# Patient Record
Sex: Female | Born: 1970 | Race: White | Hispanic: No | Marital: Married | Smoking: Never smoker
Health system: Southern US, Community
[De-identification: ages and names within clinical notes are randomized; demographics above are authoritative.]

## PROBLEM LIST (undated history)

## (undated) DIAGNOSIS — K8020 Calculus of gallbladder without cholecystitis without obstruction: Secondary | ICD-10-CM

## (undated) DIAGNOSIS — I10 Essential (primary) hypertension: Secondary | ICD-10-CM

## (undated) DIAGNOSIS — J302 Other seasonal allergic rhinitis: Secondary | ICD-10-CM

## (undated) HISTORY — PX: TUBAL LIGATION: SHX77

## (undated) HISTORY — PX: CHOLECYSTECTOMY: SHX55

## (undated) HISTORY — PX: BREAST SURGERY: SHX581

---

## 2015-07-03 ENCOUNTER — Ambulatory Visit: Payer: Self-pay | Admitting: Surgery

## 2015-07-03 DIAGNOSIS — D242 Benign neoplasm of left breast: Secondary | ICD-10-CM

## 2015-07-03 NOTE — H&P (Signed)
Kayla Newman 07/03/2015 2:49 PM Location: [facility] Patient #: [id number] DOB: [birth year] Married / Language: Kayla Newman / Race: White Female History of Present Illness Kayla Moores A. 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 This area had been followed for 6 months and had changed prompting biopsy. No pain mass or discharge. Great aunt with breast cancer.  The patient is a 44 year old female   Other Problems Kayla Newman, Kayla Newman; 07/03/2015 2:49 PM) Cholelithiasis Gastroesophageal Reflux Disease High blood pressure Kidney Stone Lump In Breast  Past Surgical History Kayla Newman, Kayla Newman; 07/03/2015 2:49 PM) Breast Biopsy Left. Cesarean Section - 1 Gallbladder Surgery - Laparoscopic Oral Surgery  Diagnostic Studies History Kayla Newman, Kayla Newman; 07/03/2015 2:49 PM) Colonoscopy never Mammogram within last year Pap Smear 1-5 years ago  Allergies Kayla Newman, Newman; 07/03/2015 2:49 PM) No Known Drug Allergies 07/03/2015  Medication History Kayla Newman, Kayla Newman; 07/03/2015 2:50 PM) Cholestyramine (4GM Packet, Oral) Active. Montelukast Sodium (10MG  Tablet, Oral) Active. Metoprolol Succinate ER (25MG  Tablet ER 24HR, Oral) Active. ZyrTEC Allergy (10MG  Tablet, Oral) Active. Flonase (50MCG/ACT Suspension, Nasal) Active.  Social History Kayla Newman, Kayla Newman; 07/03/2015 2:49 PM) Alcohol use Occasional alcohol use. Caffeine use Carbonated beverages, Tea. No drug use Tobacco use Never smoker.  Family History Kayla Newman, Kayla Newman; 07/03/2015 2:49 PM) Heart Disease Mother. Heart disease in female family member before age 31 Heart disease in female family member before age 71 Hypertension Mother. Melanoma Mother.  Pregnancy / Birth History Kayla Newman, Kayla Newman;  07/03/2015 2:49 PM) Age at menarche 78 years. Contraceptive History Contraceptive implant, Oral contraceptives. Gravida 3 Maternal age 46-20 Para 3 Regular periods     Review of Systems (Kayla Newman; 07/03/2015 2:49 PM) General Not Present- Appetite Loss, Chills, Fatigue, Fever, Night Sweats, Weight Gain and Weight Loss. Skin Not Present- Change in Wart/Mole, Dryness, Hives, Jaundice, New Lesions, Non-Healing Wounds, Rash and Ulcer. HEENT Present- Seasonal Allergies and Wears glasses/contact lenses. Not Present- Earache, Hearing Loss, Hoarseness, Nose Bleed, Oral Ulcers, Ringing in the Ears, Sinus Pain, Sore Throat, Visual Disturbances and Yellow Eyes. Respiratory Not Present- Bloody sputum, Chronic Cough, Difficulty Breathing, Snoring and Wheezing. Breast Present- Breast Mass. Not Present- Breast Pain, Nipple Discharge and Skin Changes. Cardiovascular Not Present- Chest Pain, Difficulty Breathing Lying Down, Leg Cramps, Palpitations, Rapid Heart Rate, Shortness of Breath and Swelling of Extremities. Gastrointestinal Not Present- Abdominal Pain, Bloating, Bloody Stool, Change in Bowel Habits, Chronic diarrhea, Constipation, Difficulty Swallowing, Excessive gas, Gets full quickly at meals, Hemorrhoids, Indigestion, Nausea, Rectal Pain and Vomiting. Female Genitourinary Present- Frequency. Not Present- Nocturia, Painful Urination, Pelvic Pain and Urgency. Musculoskeletal Not Present- Back Pain, Joint Pain, Joint Stiffness, Muscle Pain, Muscle Weakness and Swelling of Extremities. Neurological Not Present- Decreased Memory, Fainting, Headaches, Numbness, Seizures, Tingling, Tremor, Trouble walking and Weakness. Psychiatric Not Present- Anxiety, Bipolar, Change in Sleep Pattern, Depression, Fearful and Frequent crying. Endocrine Not Present- Cold Intolerance, Excessive Hunger, Hair Changes, Heat Intolerance, Hot flashes and New Diabetes. Hematology Present- Easy Bruising. Not  Present- Excessive bleeding, Gland problems, HIV and Persistent Infections.  Vitals Kayla Newman; 07/03/2015 2:49 PM) 07/03/2015 2:49 PM Weight: 173.13 lb Height: 59in Body Surface Area: 1.81 m Body Mass Index: 34.97 kg/m BP: 132/84 (Sitting, Left Arm, Standard)     Physical Exam (Kayla Rohrig A.  Kayla Paulsen MD; 07/03/2015 4:20 PM)  General Mental Status-Alert. General Appearance-Consistent with stated age. Hydration-Well hydrated. Voice-Normal.  Head and Neck Head-normocephalic, atraumatic with no lesions or palpable masses. Trachea-midline. Thyroid Gland Characteristics - normal size and consistency.  Eye Eyeball - Bilateral-Extraocular movements intact. Sclera/Conjunctiva - Bilateral-No scleral icterus.  Chest and Lung Exam Chest and lung exam reveals -quiet, even and easy respiratory effort with no use of accessory muscles and on auscultation, normal breath sounds, no adventitious sounds and normal vocal resonance. Inspection Chest Wall - Normal. Back - normal.  Breast Breast - Left-Symmetric, Non Tender, No Biopsy scars, no Dimpling, No Inflammation, No Lumpectomy scars, No Mastectomy scars, No Peau d' Orange. Breast - Right-Symmetric, Non Tender, No Biopsy scars, no Dimpling, No Inflammation, No Lumpectomy scars, No Mastectomy scars, No Peau d' Orange. Breast Lump-No Palpable Breast Mass.  Cardiovascular Cardiovascular examination reveals -normal heart sounds, regular rate and rhythm with no murmurs and normal pedal pulses bilaterally.  Abdomen Inspection Inspection of the abdomen reveals - No Hernias. Skin - Scar - no surgical scars. Palpation/Percussion Palpation and Percussion of the abdomen reveal - Soft, Non Tender, No Rebound tenderness, No Rigidity (guarding) and No hepatosplenomegaly. Auscultation Auscultation of the abdomen reveals - Bowel sounds normal.  Neurologic Neurologic evaluation reveals -alert and oriented x 3  with no impairment of recent or remote memory. Mental Status-Normal.  Musculoskeletal Normal Exam - Left-Upper Extremity Strength Normal and Lower Extremity Strength Normal. Normal Exam - Right-Upper Extremity Strength Normal and Lower Extremity Strength Normal.  Lymphatic Head & Neck  General Head & Neck Lymphatics: Bilateral - Description - Normal. Axillary  General Axillary Region: Bilateral - Description - Normal. Tenderness - Non Tender. Femoral & Inguinal  Generalized Femoral & Inguinal Lymphatics: Bilateral - Description - Normal. Tenderness - Non Tender.    Assessment & Plan (Tinaya Ceballos A. 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 Risk of occult malignancy is 3 % or less. She agrees to proceed. Risk of lumpectomy include bleeding, infection, seroma, more surgery, use of seed/wire, wound care, cosmetic deformity and the need for other treatments, death , blood clots, death. Pt agrees to proceed.  Current Plans Pt Education - [facility] Breast Biopsy [facility]

## 2015-08-06 ENCOUNTER — Encounter (HOSPITAL_BASED_OUTPATIENT_CLINIC_OR_DEPARTMENT_OTHER): Payer: Self-pay | Admitting: *Deleted

## 2015-08-11 ENCOUNTER — Encounter (HOSPITAL_BASED_OUTPATIENT_CLINIC_OR_DEPARTMENT_OTHER)
Admission: RE | Admit: 2015-08-11 | Discharge: 2015-08-11 | Disposition: A | Discharge status: Home / Self Care | Payer: BLUE CROSS/BLUE SHIELD | Source: Ambulatory Visit | Attending: Surgery | Admitting: Surgery

## 2015-08-11 DIAGNOSIS — Z6834 Body mass index (BMI) 34.0-34.9, adult: Secondary | ICD-10-CM | POA: Diagnosis not present

## 2015-08-11 DIAGNOSIS — D242 Benign neoplasm of left breast: Secondary | ICD-10-CM | POA: Diagnosis present

## 2015-08-11 DIAGNOSIS — I10 Essential (primary) hypertension: Secondary | ICD-10-CM | POA: Diagnosis not present

## 2015-08-11 LAB — BASIC METABOLIC PANEL
Anion gap: 7 (ref 5–15)
BUN: 10 mg/dL (ref 6–20)
CO2: 24 mmol/L (ref 22–32)
Calcium: 9.1 mg/dL (ref 8.9–10.3)
Chloride: 106 mmol/L (ref 101–111)
Creatinine, Ser: 0.88 mg/dL (ref 0.44–1.00)
GFR calc Af Amer: >60 mL/min (ref >60)
GFR calc non Af Amer: >60 mL/min (ref >60)
Glucose, Bld: 106 mg/dL — ABNORMAL HIGH (ref 65–99)
POTASSIUM: 4.5 mmol/L (ref 3.5–5.1)
Sodium: 137 mmol/L (ref 135–145)

## 2015-08-12 ENCOUNTER — Ambulatory Visit (HOSPITAL_BASED_OUTPATIENT_CLINIC_OR_DEPARTMENT_OTHER): Payer: BLUE CROSS/BLUE SHIELD | Admitting: Anesthesiology

## 2015-08-12 ENCOUNTER — Encounter (HOSPITAL_BASED_OUTPATIENT_CLINIC_OR_DEPARTMENT_OTHER): Payer: Self-pay | Admitting: Anesthesiology

## 2015-08-12 ENCOUNTER — Encounter (HOSPITAL_BASED_OUTPATIENT_CLINIC_OR_DEPARTMENT_OTHER)
Admission: RE | Disposition: A | Discharge status: Home / Self Care | Payer: Self-pay | Source: Ambulatory Visit | Attending: Surgery

## 2015-08-12 ENCOUNTER — Ambulatory Visit (HOSPITAL_BASED_OUTPATIENT_CLINIC_OR_DEPARTMENT_OTHER)
Admission: RE | Admit: 2015-08-12 | Discharge: 2015-08-12 | Disposition: A | Discharge status: Home / Self Care | Payer: BLUE CROSS/BLUE SHIELD | Source: Ambulatory Visit | Attending: Surgery | Admitting: Surgery

## 2015-08-12 DIAGNOSIS — Z6834 Body mass index (BMI) 34.0-34.9, adult: Secondary | ICD-10-CM | POA: Insufficient documentation

## 2015-08-12 DIAGNOSIS — I10 Essential (primary) hypertension: Secondary | ICD-10-CM | POA: Insufficient documentation

## 2015-08-12 DIAGNOSIS — D242 Benign neoplasm of left breast: Secondary | ICD-10-CM | POA: Diagnosis not present

## 2015-08-12 HISTORY — DX: Calculus of gallbladder without cholecystitis without obstruction: K80.20

## 2015-08-12 HISTORY — DX: Essential (primary) hypertension: I10

## 2015-08-12 HISTORY — DX: Other seasonal allergic rhinitis: J30.2

## 2015-08-12 HISTORY — PX: BREAST LUMPECTOMY WITH RADIOACTIVE SEED LOCALIZATION: SHX6424

## 2015-08-12 LAB — POCT HEMOGLOBIN-HEMACUE: Hemoglobin: 12 g/dL (ref 12.0–15.0)

## 2015-08-12 SURGERY — BREAST LUMPECTOMY WITH RADIOACTIVE SEED LOCALIZATION
Anesthesia: General | Site: Breast | Laterality: Left

## 2015-08-12 MED ORDER — BUPIVACAINE-EPINEPHRINE (PF) 0.25% -1:200000 IJ SOLN
INTRAMUSCULAR | Status: DC | PRN
  Administered 2015-08-12: 20 mL

## 2015-08-12 MED ORDER — HYDROMORPHONE HCL 1 MG/ML IJ SOLN
INTRAMUSCULAR | Status: AC
  Filled 2015-08-12: qty 1

## 2015-08-12 MED ORDER — CEFAZOLIN SODIUM-DEXTROSE 2-3 GM-% IV SOLR
INTRAVENOUS | Status: AC
  Filled 2015-08-12: qty 50

## 2015-08-12 MED ORDER — LIDOCAINE HCL (CARDIAC) 20 MG/ML IV SOLN
INTRAVENOUS | Status: DC | PRN
  Administered 2015-08-12: 50 mg via INTRAVENOUS

## 2015-08-12 MED ORDER — MIDAZOLAM HCL 2 MG/2ML IJ SOLN
INTRAMUSCULAR | Status: AC
  Filled 2015-08-12: qty 2

## 2015-08-12 MED ORDER — CHLORHEXIDINE GLUCONATE 4 % EX LIQD: 1.0000 "application " | Freq: Once | CUTANEOUS | Status: DC

## 2015-08-12 MED ORDER — DEXAMETHASONE SODIUM PHOSPHATE 4 MG/ML IJ SOLN
INTRAMUSCULAR | Status: DC | PRN
  Administered 2015-08-12: 10 mg via INTRAVENOUS

## 2015-08-12 MED ORDER — PROPOFOL 10 MG/ML IV BOLUS
INTRAVENOUS | Status: DC | PRN
  Administered 2015-08-12: 20 mg via INTRAVENOUS
  Administered 2015-08-12: 150 mg via INTRAVENOUS

## 2015-08-12 MED ORDER — SCOPOLAMINE 1 MG/3DAYS TD PT72: 1.0000 | MEDICATED_PATCH | Freq: Once | TRANSDERMAL | Status: DC | PRN

## 2015-08-12 MED ORDER — OXYCODONE-ACETAMINOPHEN 5-325 MG PO TABS: 1.0000 | ORAL_TABLET | ORAL | Status: AC | PRN

## 2015-08-12 MED ORDER — GLYCOPYRROLATE 0.2 MG/ML IJ SOLN: 0.2000 mg | Freq: Once | INTRAMUSCULAR | Status: DC | PRN

## 2015-08-12 MED ORDER — DEXTROSE 5 % IV SOLN
3.0000 g | INTRAVENOUS | Status: AC
  Administered 2015-08-12: 2 g via INTRAVENOUS

## 2015-08-12 MED ORDER — MIDAZOLAM HCL 2 MG/2ML IJ SOLN: 1.0000 mg | INTRAMUSCULAR | Status: DC | PRN

## 2015-08-12 MED ORDER — MEPERIDINE HCL 25 MG/ML IJ SOLN: 6.2500 mg | INTRAMUSCULAR | Status: DC | PRN

## 2015-08-12 MED ORDER — LACTATED RINGERS IV SOLN
INTRAVENOUS | Status: DC
  Administered 2015-08-12 (×2): via INTRAVENOUS

## 2015-08-12 MED ORDER — OXYCODONE HCL 5 MG/5ML PO SOLN: 5.0000 mg | Freq: Once | ORAL | Status: AC | PRN

## 2015-08-12 MED ORDER — FENTANYL CITRATE (PF) 100 MCG/2ML IJ SOLN: 50.0000 ug | INTRAMUSCULAR | Status: DC | PRN

## 2015-08-12 MED ORDER — FENTANYL CITRATE (PF) 100 MCG/2ML IJ SOLN
INTRAMUSCULAR | Status: AC
  Filled 2015-08-12: qty 2

## 2015-08-12 MED ORDER — FENTANYL CITRATE (PF) 100 MCG/2ML IJ SOLN
INTRAMUSCULAR | Status: DC | PRN
  Administered 2015-08-12: 100 ug via INTRAVENOUS
  Administered 2015-08-12: 25 ug via INTRAVENOUS

## 2015-08-12 MED ORDER — PROMETHAZINE HCL 25 MG/ML IJ SOLN: 6.2500 mg | INTRAMUSCULAR | Status: DC | PRN

## 2015-08-12 MED ORDER — OXYCODONE HCL 5 MG PO TABS
ORAL_TABLET | ORAL | Status: AC
  Filled 2015-08-12: qty 1

## 2015-08-12 MED ORDER — OXYCODONE HCL 5 MG PO TABS
5.0000 mg | ORAL_TABLET | Freq: Once | ORAL | Status: AC | PRN
  Administered 2015-08-12: 5 mg via ORAL

## 2015-08-12 MED ORDER — FENTANYL CITRATE (PF) 100 MCG/2ML IJ SOLN
INTRAMUSCULAR | Status: AC
  Filled 2015-08-12: qty 6

## 2015-08-12 MED ORDER — HYDROMORPHONE HCL 1 MG/ML IJ SOLN
0.2500 mg | INTRAMUSCULAR | Status: DC | PRN
  Administered 2015-08-12 (×2): 0.5 mg via INTRAVENOUS

## 2015-08-12 MED ORDER — MIDAZOLAM HCL 5 MG/5ML IJ SOLN
INTRAMUSCULAR | Status: DC | PRN
  Administered 2015-08-12: 2 mg via INTRAVENOUS

## 2015-08-12 SURGICAL SUPPLY — 51 items
APPLIER CLIP 9.375 MED OPEN (MISCELLANEOUS)
BINDER BREAST LRG (GAUZE/BANDAGES/DRESSINGS) IMPLANT
BINDER BREAST MEDIUM (GAUZE/BANDAGES/DRESSINGS) IMPLANT
BINDER BREAST XLRG (GAUZE/BANDAGES/DRESSINGS) IMPLANT
BINDER BREAST XXLRG (GAUZE/BANDAGES/DRESSINGS) IMPLANT
BLADE SURG 15 STRL LF DISP TIS (BLADE) ×1 IMPLANT
BLADE SURG 15 STRL SS (BLADE) ×2
CANISTER SUC SOCK COL 7IN (MISCELLANEOUS) IMPLANT
CANISTER SUCT 1200ML W/VALVE (MISCELLANEOUS) IMPLANT
CHLORAPREP W/TINT 26ML (MISCELLANEOUS) ×3 IMPLANT
CLIP APPLIE 9.375 MED OPEN (MISCELLANEOUS) IMPLANT
CLIP TI WIDE RED SMALL 6 (CLIP) ×3 IMPLANT
COVER BACK TABLE 60X90IN (DRAPES) ×3 IMPLANT
COVER MAYO STAND STRL (DRAPES) ×3 IMPLANT
COVER PROBE W GEL 5X96 (DRAPES) ×3 IMPLANT
DECANTER SPIKE VIAL GLASS SM (MISCELLANEOUS) IMPLANT
DEVICE DUBIN W/COMP PLATE 8390 (MISCELLANEOUS) ×3 IMPLANT
DRAPE LAPAROSCOPIC ABDOMINAL (DRAPES) IMPLANT
DRAPE LAPAROTOMY 100X72 PEDS (DRAPES) ×3 IMPLANT
DRAPE UTILITY XL STRL (DRAPES) ×3 IMPLANT
ELECT COATED BLADE 2.86 ST (ELECTRODE) ×3 IMPLANT
ELECT REM PT RETURN 9FT ADLT (ELECTROSURGICAL) ×3
ELECTRODE REM PT RTRN 9FT ADLT (ELECTROSURGICAL) ×1 IMPLANT
GLOVE BIO SURGEON STRL SZ 6.5 (GLOVE) ×6 IMPLANT
GLOVE BIO SURGEONS STRL SZ 6.5 (GLOVE) ×3
GLOVE BIOGEL PI IND STRL 7.0 (GLOVE) ×2 IMPLANT
GLOVE BIOGEL PI IND STRL 8 (GLOVE) ×1 IMPLANT
GLOVE BIOGEL PI INDICATOR 7.0 (GLOVE) ×4
GLOVE BIOGEL PI INDICATOR 8 (GLOVE) ×2
GLOVE ECLIPSE 8.0 STRL XLNG CF (GLOVE) ×6 IMPLANT
GLOVE EXAM NITRILE EXT CUFF MD (GLOVE) ×3 IMPLANT
GOWN STRL REUS W/ TWL LRG LVL3 (GOWN DISPOSABLE) ×3 IMPLANT
GOWN STRL REUS W/TWL LRG LVL3 (GOWN DISPOSABLE) ×6
HEMOSTAT SNOW SURGICEL 2X4 (HEMOSTASIS) IMPLANT
KIT MARKER MARGIN INK (KITS) ×3 IMPLANT
LIQUID BAND (GAUZE/BANDAGES/DRESSINGS) ×3 IMPLANT
NEEDLE HYPO 25X1 1.5 SAFETY (NEEDLE) ×3 IMPLANT
NS IRRIG 1000ML POUR BTL (IV SOLUTION) IMPLANT
PACK BASIN DAY SURGERY FS (CUSTOM PROCEDURE TRAY) ×3 IMPLANT
PENCIL BUTTON HOLSTER BLD 10FT (ELECTRODE) ×3 IMPLANT
SLEEVE SCD COMPRESS KNEE MED (MISCELLANEOUS) ×3 IMPLANT
SPONGE LAP 4X18 X RAY DECT (DISPOSABLE) ×3 IMPLANT
SUT MNCRL AB 4-0 PS2 18 (SUTURE) ×3 IMPLANT
SUT SILK 2 0 SH (SUTURE) IMPLANT
SUT VICRYL 3-0 CR8 SH (SUTURE) ×3 IMPLANT
SYR CONTROL 10ML LL (SYRINGE) ×3 IMPLANT
TOWEL OR 17X24 6PK STRL BLUE (TOWEL DISPOSABLE) ×3 IMPLANT
TOWEL OR NON WOVEN STRL DISP B (DISPOSABLE) IMPLANT
TUBE CONNECTING 20'X1/4 (TUBING)
TUBE CONNECTING 20X1/4 (TUBING) IMPLANT
YANKAUER SUCT BULB TIP NO VENT (SUCTIONS) IMPLANT

## 2015-08-12 NOTE — Transfer of Care (Signed)
Immediate Anesthesia Transfer of Care Note  Patient: Kayla Newman  Procedure(s) Performed: Procedure(s): LEFT BREAST LUMPECTOMY WITH RADIOACTIVE SEED LOCALIZATION (Left)  Patient Location: PACU  Anesthesia Type:General  Level of Consciousness: awake and alert   Airway & Oxygen Therapy: Patient Spontanous Breathing and Patient connected to face mask oxygen  Post-op Assessment: Report given to RN and Post -op Vital signs reviewed and stable  Post vital signs: Reviewed and stable  Last Vitals:  Filed Vitals:   08/12/15 0901  BP: 146/86  Pulse: 81  Temp: 36.9 C  Resp: 20    Complications: No apparent anesthesia complications

## 2015-08-12 NOTE — Discharge Instructions (Signed)
[facility] °Office Phone Number [phone] ° °BREAST BIOPSY/ PARTIAL MASTECTOMY: POST OP INSTRUCTIONS ° °Always review your discharge instruction sheet given to you by the facility where your surgery was performed. ° °IF YOU HAVE DISABILITY OR FAMILY LEAVE FORMS, YOU MUST BRING THEM TO THE OFFICE FOR PROCESSING.  DO NOT GIVE THEM TO YOUR DOCTOR. ° °1. A prescription for pain medication may be given to you upon discharge.  Take your pain medication as prescribed, if needed.  If narcotic pain medicine is not needed, then you may take acetaminophen (Tylenol) or ibuprofen (Advil) as needed. °2. Take your usually prescribed medications unless otherwise directed °3. If you need a refill on your pain medication, please contact your pharmacy.  They will contact our office to request authorization.  Prescriptions will not be filled after 5pm or on week-ends. °4. You should eat very light the first 24 hours after surgery, such as soup, crackers, pudding, etc.  Resume your normal diet the day after surgery. °5. Most patients will experience some swelling and bruising in the breast.  Ice packs and a good support bra will help.  Swelling and bruising can take several days to resolve.  °6. It is common to experience some constipation if taking pain medication after surgery.  Increasing fluid intake and taking a stool softener will usually help or prevent this problem from occurring.  A mild laxative (Milk of Magnesia or Miralax) should be taken according to package directions if there are no bowel movements after 48 hours. °7. Unless discharge instructions indicate otherwise, you may remove your bandages 24-48 hours after surgery, and you may shower at that time.  You may have steri-strips (small skin tapes) in place directly over the incision.  These strips should be left on the skin for 7-10 days.  If your surgeon used skin glue on the incision, you may shower in 24 hours.  The glue will flake off over the  next 2-3 weeks.  Any sutures or staples will be removed at the office during your follow-up visit. °8. ACTIVITIES:  You may resume regular daily activities (gradually increasing) beginning the next day.  Wearing a good support bra or sports bra minimizes pain and swelling.  You may have sexual intercourse when it is comfortable. °a. You may drive when you no longer are taking prescription pain medication, you can comfortably wear a seatbelt, and you can safely maneuver your car and apply brakes. °b. RETURN TO WORK:  ______________________________________________________________________________________ °9. You should see your doctor in the office for a follow-up appointment approximately two weeks after your surgery.  Your doctor’s nurse will typically make your follow-up appointment when she calls you with your pathology report.  Expect your pathology report 2-3 business days after your surgery.  You may call to check if you do not hear from us after three days. °10. OTHER INSTRUCTIONS: _______________________________________________________________________________________________ _____________________________________________________________________________________________________________________________________ °_____________________________________________________________________________________________________________________________________ °_____________________________________________________________________________________________________________________________________ ° °WHEN TO CALL YOUR DOCTOR: °1. Fever over 101.0 °2. Nausea and/or vomiting. °3. Extreme swelling or bruising. °4. Continued bleeding from incision. °5. Increased pain, redness, or drainage from the incision. ° °The clinic staff is available to answer your questions during regular business hours.  Please don’t hesitate to call and ask to speak to one of the nurses for clinical concerns.  If you have a medical emergency, go to the nearest  emergency room or call 911.  A surgeon from [facility] is always on call at the hospital. ° °For further questions, please visit [email or web address]  ° ° ° °  Post Anesthesia Home Care Instructions  Activity: Get plenty of rest for the remainder of the day. A responsible adult should stay with you for 24 hours following the procedure.  For the next 24 hours, DO NOT: -Drive a car -Paediatric nurse -Drink alcoholic beverages -Take any medication unless instructed by your physician -Make any legal decisions or sign important papers.  Meals: Start with liquid foods such as gelatin or soup. Progress to regular foods as tolerated. Avoid greasy, spicy, heavy foods. If nausea and/or vomiting occur, drink only clear liquids until the nausea and/or vomiting subsides. Call your physician if vomiting continues.  Special Instructions/Symptoms: Your throat may feel dry or sore from the anesthesia or the breathing tube placed in your throat during surgery. If this causes discomfort, gargle with warm salt water. The discomfort should disappear within 24 hours.  If you had a scopolamine patch placed behind your ear for the management of post- operative nausea and/or vomiting:  1. The medication in the patch is effective for 72 hours, after which it should be removed.  Wrap patch in a tissue and discard in the trash. Wash hands thoroughly with soap and water. 2. You may remove the patch earlier than 72 hours if you experience unpleasant side effects which may include dry mouth, dizziness or visual disturbances. 3. Avoid touching the patch. Wash your hands with soap and water after contact with the patch.   Call your surgeon if you experience:   1.  Fever over 101.0. 2.  Inability to urinate. 3.  Nausea and/or vomiting. 4.  Extreme swelling or bruising at the surgical site. 5.  Continued bleeding from the incision. 6.  Increased pain, redness or drainage from the incision. 7.   Problems related to your pain medication. 8. Any change in color, movement and/or sensation 9. Any problems and/or concerns

## 2015-08-12 NOTE — Op Note (Signed)
Preoperative diagnosis: Left breast papilloma  Postoperative diagnosis: Same   Procedure: Left breast seed localized lumpectomy  Surgeon: Erroll Luna M.D.  Anesthesia: Gen. With 0.25% Sensorcaine local with epinephrine   EBL: 20 cc  Specimen: Left breast tissue with clip and radioactive seed in the specimen. Verified with neoprobe and radiographic image showing both seed and clip in specimen  Indications for procedure: The patient presents for left breast excisional lumpectomy after core biopsy showed papilloma. Discussed the rationale for considering excision. Small risk of malignancy associated with papilloma lesion after core biopsy. Discussed observation. Discussed wire localization. Patient desired excision of left breast papilloma.The procedure has been discussed with the patient. Alternatives to surgery have been discussed with the patient.  Risks of surgery include bleeding,  Infection,  Seroma formation, death,  and the need for further surgery.   The patient understands and wishes to proceed.   Description of procedure: Patient underwent seed placement as an outpatient. Patient presents today for left breast seed localized lumpectomy. Patient and holding area. Questions are answered and neoprobe used to verify seed location. Patient taken back to the operating room and placed upon the OR table. After induction of general anesthesia, left breast prepped and draped in a sterile fashion. Timeout was done to verify proper sizing procedure. Neoprobe used and hot spot identified and left breast upper-outer quadrant. This was marked with pen. Curvilinear incision made left upper outer quadrant breast. Dissection used with the help of a neoprobe around the tissue where the seed and clip were located. Tissue removed in its entirety with gross margins.Marlis Edelson used and seen within specimen. Radiographs taken which show clip and seed  In specimen.hemostasis achieved and cavity closed with 3-0  Vicryl and 4-0 Monocryl. Dermabond applied. All final counts found to be correct. Specimen transported to pathology. Patient awoke extubated taken to recovery in satisfactory condition.

## 2015-08-12 NOTE — Interval H&P Note (Signed)
History and Physical Interval Note:  08/12/2015 10:33 AM  Kayla Newman  has presented today for surgery, with the diagnosis of Left Breast Papilloma  The various methods of treatment have been discussed with the patient and family. After consideration of risks, benefits and other options for treatment, the patient has consented to  Procedure(s): LEFT BREAST LUMPECTOMY WITH RADIOACTIVE SEED LOCALIZATION (Left) as a surgical intervention .  The patient's history has been reviewed, patient examined, no change in status, stable for surgery.  I have reviewed the patient's chart and labs.  Questions were answered to the patient's satisfaction.     Bryla Burek A.

## 2015-08-12 NOTE — Anesthesia Procedure Notes (Signed)
Procedure Name: LMA Insertion Date/Time: 08/12/2015 11:25 AM Performed by: Toula Moos L Pre-anesthesia Checklist: Patient identified, Emergency Drugs available, Suction available, Patient being monitored and Timeout performed Patient Re-evaluated:Patient Re-evaluated prior to inductionOxygen Delivery Method: Circle System Utilized Preoxygenation: Pre-oxygenation with 100% oxygen Intubation Type: IV induction Ventilation: Mask ventilation without difficulty LMA: LMA inserted LMA Size: 3.0 Number of attempts: 1 Airway Equipment and Method: Bite block Placement Confirmation: positive ETCO2 Tube secured with: Tape Dental Injury: Teeth and Oropharynx as per pre-operative assessment

## 2015-08-12 NOTE — Anesthesia Preprocedure Evaluation (Signed)
Anesthesia Evaluation  Patient identified by MRN, date of birth, ID band Patient awake    Reviewed: Allergy & Precautions, NPO status , Patient's Chart, lab work & pertinent test results, reviewed documented beta blocker date and time   Airway Mallampati: II  TM Distance: >3 FB Neck ROM: Full    Dental no notable dental hx.    Pulmonary neg pulmonary ROS,  breath sounds clear to auscultation  Pulmonary exam normal       Cardiovascular hypertension, Pt. on home beta blockers Normal cardiovascular examRhythm:Regular Rate:Normal     Neuro/Psych negative neurological ROS  negative psych ROS   GI/Hepatic negative GI ROS, Neg liver ROS,   Endo/Other  Morbid obesity  Renal/GU negative Renal ROS  negative genitourinary   Musculoskeletal negative musculoskeletal ROS (+)   Abdominal (+) + obese,   Peds  Hematology negative hematology ROS (+)   Anesthesia Other Findings   Reproductive/Obstetrics negative OB ROS                             Anesthesia Physical Anesthesia Plan  ASA: II  Anesthesia Plan: General   Post-op Pain Management:    Induction: Intravenous  Airway Management Planned: LMA  Additional Equipment:   Intra-op Plan:   Post-operative Plan: Extubation in OR  Informed Consent: I have reviewed the patients History and Physical, chart, labs and discussed the procedure including the risks, benefits and alternatives for the proposed anesthesia with the patient or authorized representative who has indicated his/her understanding and acceptance.   Dental advisory given  Plan Discussed with: CRNA  Anesthesia Plan Comments:         Anesthesia Quick Evaluation

## 2015-08-12 NOTE — H&P (Signed)
H&P   Kayla Newman (MR# [id number])      H&P Info    Author Note Status Last Update User Last Update Date/Time   Erroll Luna, MD Signed Erroll Luna, MD 07/03/2015 4:21 PM    H&P    Expand All Collapse All   [name] 07/03/2015 2:49 PM Location: [facility] Patient #: [id number] DOB: 22-Aug-1971 Married / Language: Kayla Newman / Race: White Female History of Present Illness Marcello Moores A. 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 This area had been followed for 6 months and had changed prompting biopsy. No pain mass or discharge. Great aunt with breast cancer.  The patient is a 44 year old female   Other Problems [name], [place]; 07/03/2015 2:49 PM) Cholelithiasis Gastroesophageal Reflux Disease High blood pressure Kidney Stone Lump In Breast  Past Surgical History [name], [place]; 07/03/2015 2:49 PM) Breast Biopsy Left. Cesarean Section - 1 Gallbladder Surgery - Laparoscopic Oral Surgery  Diagnostic Studies History [name], [place]; 07/03/2015 2:49 PM) Colonoscopy never Mammogram within last year Pap Smear 1-5 years ago  Allergies [name], CMA; 07/03/2015 2:49 PM) No Known Drug Allergies 07/03/2015  Medication History [name], [place]; 07/03/2015 2:50 PM) Cholestyramine (4GM Packet, Oral) Active. Montelukast Sodium (10MG  Tablet, Oral) Active. Metoprolol Succinate ER (25MG  Tablet ER 24HR, Oral) Active. ZyrTEC Allergy (10MG  Tablet, Oral) Active. Flonase (50MCG/ACT Suspension, Nasal) Active.  Social History [name], [place]; 07/03/2015 2:49 PM) Alcohol use Occasional alcohol use. Caffeine use Carbonated beverages, Tea. No drug use Tobacco use Never smoker.  Family History [name], [place]; 07/03/2015 2:49  PM) Heart Disease Mother. Heart disease in female family member before age 60 Heart disease in female family member before age 43 Hypertension Mother. Melanoma Mother.  Pregnancy / Birth History [name], [place]; 07/03/2015 2:49 PM) Age at menarche 36 years. Contraceptive History Contraceptive implant, Oral contraceptives. Gravida 3 Maternal age 54-20 Para 3 Regular periods     Review of Systems ([place]on. Brooks CMA; 07/03/2015 2:49 PM) General Not Present- Appetite Loss, Chills, Fatigue, Fever, Night Sweats, Weight Gain and Weight Loss. Skin Not Present- Change in Wart/Mole, Dryness, Hives, Jaundice, New Lesions, Non-Healing Wounds, Rash and Ulcer. HEENT Present- Seasonal Allergies and Wears glasses/contact lenses. Not Present- Earache, Hearing Loss, Hoarseness, Nose Bleed, Oral Ulcers, Ringing in the Ears, Sinus Pain, Sore Throat, Visual Disturbances and Yellow Eyes. Respiratory Not Present- Bloody sputum, Chronic Cough, Difficulty Breathing, Snoring and Wheezing. Breast Present- Breast Mass. Not Present- Breast Pain, Nipple Discharge and Skin Changes. Cardiovascular Not Present- Chest Pain, Difficulty Breathing Lying Down, Leg Cramps, Palpitations, Rapid Heart Rate, Shortness of Breath and Swelling of Extremities. Gastrointestinal Not Present- Abdominal Pain, Bloating, Bloody Stool, Change in Bowel Habits, Chronic diarrhea, Constipation, Difficulty Swallowing, Excessive gas, Gets full quickly at meals, Hemorrhoids, Indigestion, Nausea, Rectal Pain and Vomiting. Female Genitourinary Present- Frequency. Not Present- Nocturia, Painful Urination, Pelvic Pain and Urgency. Musculoskeletal Not Present- Back Pain, Joint Pain, Joint Stiffness, Muscle Pain, Muscle Weakness and Swelling of Extremities. Neurological Not Present- Decreased Memory, Fainting, Headaches, Numbness, Seizures, Tingling, Tremor, Trouble walking and Weakness. Psychiatric Not Present- Anxiety, Bipolar,  Change in Sleep Pattern, Depression, Fearful and Frequent crying. Endocrine Not Present- Cold Intolerance, Excessive Hunger, Hair Changes, Heat Intolerance, Hot flashes and New Diabetes. Hematology Present- Easy Bruising.  Not Present- Excessive bleeding, Gland problems, HIV and Persistent Infections.  Vitals [employer] R. Brooks CMA; 07/03/2015 2:49 PM) 07/03/2015 2:49 PM Weight: 173.13 lb Height: 59in Body Surface Area: 1.81 m Body Mass Index: 34.97 kg/m BP: 132/84 (Sitting, Left Arm, Standard)     Physical Exam (Mccartney Chuba A. Alya Smaltz MD; 07/03/2015 4:20 PM)  General Mental Status-Alert. General Appearance-Consistent with stated age. Hydration-Well hydrated. Voice-Normal.  Head and Neck Head-normocephalic, atraumatic with no lesions or palpable masses. Trachea-midline. Thyroid Gland Characteristics - normal size and consistency.  Eye Eyeball - Bilateral-Extraocular movements intact. Sclera/Conjunctiva - Bilateral-No scleral icterus.  Chest and Lung Exam Chest and lung exam reveals -quiet, even and easy respiratory effort with no use of accessory muscles and on auscultation, normal breath sounds, no adventitious sounds and normal vocal resonance. Inspection Chest Wall - Normal. Back - normal.  Breast Breast - Left-Symmetric, Non Tender, No Biopsy scars, no Dimpling, No Inflammation, No Lumpectomy scars, No Mastectomy scars, No Peau d' Orange. Breast - Right-Symmetric, Non Tender, No Biopsy scars, no Dimpling, No Inflammation, No Lumpectomy scars, No Mastectomy scars, No Peau d' Orange. Breast Lump-No Palpable Breast Mass.  Cardiovascular Cardiovascular examination reveals -normal heart sounds, regular rate and rhythm with no murmurs and normal pedal pulses bilaterally.  Abdomen Inspection Inspection of the abdomen reveals - No Hernias. Skin - Scar - no surgical scars. Palpation/Percussion Palpation and Percussion of the abdomen reveal - Soft,  Non Tender, No Rebound tenderness, No Rigidity (guarding) and No hepatosplenomegaly. Auscultation Auscultation of the abdomen reveals - Bowel sounds normal.  Neurologic Neurologic evaluation reveals -alert and oriented x 3 with no impairment of recent or remote memory. Mental Status-Normal.  Musculoskeletal Normal Exam - Left-Upper Extremity Strength Normal and Lower Extremity Strength Normal. Normal Exam - Right-Upper Extremity Strength Normal and Lower Extremity Strength Normal.  Lymphatic Head & Neck  General Head & Neck Lymphatics: Bilateral - Description - Normal. Axillary  General Axillary Region: Bilateral - Description - Normal. Tenderness - Non Tender. Femoral & Inguinal  Generalized Femoral & Inguinal Lymphatics: Bilateral - Description - Normal. Tenderness - Non Tender.    Assessment & Plan (Joniqua Sidle A. 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 Risk of occult malignancy is 3 % or less. She agrees to proceed. Risk of lumpectomy include bleeding, infection, seroma, more surgery, use of seed/wire, wound care, cosmetic deformity and the need for other treatments, death , blood clots, death. Pt agrees to proceed.  Current Plans Pt Education - [facility] Breast Biopsy [facility]

## 2015-08-13 ENCOUNTER — Encounter (HOSPITAL_BASED_OUTPATIENT_CLINIC_OR_DEPARTMENT_OTHER): Payer: Self-pay | Admitting: Surgery

## 2015-08-13 NOTE — Anesthesia Postprocedure Evaluation (Signed)
Anesthesia Post Note  Patient: Kayla Newman  Procedure(s) Performed: Procedure(s) (LRB): LEFT BREAST LUMPECTOMY WITH RADIOACTIVE SEED LOCALIZATION (Left)  Anesthesia type: General  Patient location: PACU  Post pain: Pain level controlled  Post assessment: Post-op Vital signs reviewed  Last Vitals: BP 136/77 mmHg  Pulse 90  Temp(Src) 37.2 C (Oral)  Resp 18  Ht 4\' 11"  (1.499 m)  Wt 170 lb (77.111 kg)  BMI 34.32 kg/m2  SpO2 97%  LMP 07/11/2015 (Exact Date)  Post vital signs: Reviewed  Level of consciousness: sedated  Complications: No apparent anesthesia complications

## 2016-07-05 DIAGNOSIS — K529 Noninfective gastroenteritis and colitis, unspecified: Secondary | ICD-10-CM | POA: Insufficient documentation

## 2016-07-05 DIAGNOSIS — J302 Other seasonal allergic rhinitis: Secondary | ICD-10-CM | POA: Insufficient documentation

## 2016-07-05 DIAGNOSIS — I10 Essential (primary) hypertension: Secondary | ICD-10-CM | POA: Insufficient documentation

## 2016-07-05 DIAGNOSIS — R7303 Prediabetes: Secondary | ICD-10-CM | POA: Insufficient documentation

## 2016-07-05 DIAGNOSIS — D242 Benign neoplasm of left breast: Secondary | ICD-10-CM | POA: Insufficient documentation

## 2016-08-03 ENCOUNTER — Telehealth: Payer: Self-pay

## 2016-08-03 NOTE — Telephone Encounter (Signed)
Patient called today stating she was scheduled to see Dr.Woodham on 08/06/16. Her PCP from [facility] in [place] sent referral to [place]. She had an [place] and Mammogram today and is scheduled for a breast biopsy on 08/25/16 with the breast imaging center at [place].   I let [facility] know that since [facility] is following her care that she does not need to keep her appointment with Dr. Adonis Huguenin, however if she needs our services in the future to reach out and give our office a call. Patient verbalized understanding.

## 2016-08-05 ENCOUNTER — Other Ambulatory Visit: Payer: Self-pay | Admitting: Nurse Practitioner

## 2016-08-05 DIAGNOSIS — K529 Noninfective gastroenteritis and colitis, unspecified: Secondary | ICD-10-CM

## 2016-08-05 DIAGNOSIS — R634 Abnormal weight loss: Secondary | ICD-10-CM

## 2016-08-06 ENCOUNTER — Encounter: Payer: BLUE CROSS/BLUE SHIELD | Admitting: General Surgery

## 2016-08-09 ENCOUNTER — Ambulatory Visit
Admission: RE | Admit: 2016-08-09 | Discharge: 2016-08-09 | Disposition: A | Discharge status: Home / Self Care | Payer: BLUE CROSS/BLUE SHIELD | Source: Ambulatory Visit | Attending: Nurse Practitioner | Admitting: Nurse Practitioner

## 2016-08-09 DIAGNOSIS — R938 Abnormal findings on diagnostic imaging of other specified body structures: Secondary | ICD-10-CM | POA: Insufficient documentation

## 2016-08-09 DIAGNOSIS — K529 Noninfective gastroenteritis and colitis, unspecified: Secondary | ICD-10-CM | POA: Insufficient documentation

## 2016-08-09 DIAGNOSIS — R634 Abnormal weight loss: Secondary | ICD-10-CM | POA: Diagnosis not present

## 2016-08-09 MED ORDER — IOPAMIDOL (ISOVUE-370) INJECTION 76%
100.0000 mL | Freq: Once | INTRAVENOUS | Status: AC | PRN
  Administered 2016-08-09: 100 mL via INTRAVENOUS

## 2016-09-09 NOTE — Progress Notes (Signed)
error 

## 2016-10-12 ENCOUNTER — Encounter: Payer: Self-pay | Admitting: *Deleted

## 2016-10-13 ENCOUNTER — Encounter: Payer: Self-pay | Admitting: *Deleted

## 2016-10-13 ENCOUNTER — Ambulatory Visit: Payer: BLUE CROSS/BLUE SHIELD | Admitting: Certified Registered"

## 2016-10-13 ENCOUNTER — Encounter
Admission: RE | Disposition: A | Discharge status: Home / Self Care | Payer: Self-pay | Source: Ambulatory Visit | Attending: Unknown Physician Specialty

## 2016-10-13 ENCOUNTER — Ambulatory Visit
Admission: RE | Admit: 2016-10-13 | Discharge: 2016-10-13 | Disposition: A | Discharge status: Home / Self Care | Payer: BLUE CROSS/BLUE SHIELD | Source: Ambulatory Visit | Attending: Unknown Physician Specialty | Admitting: Unknown Physician Specialty

## 2016-10-13 DIAGNOSIS — K640 First degree hemorrhoids: Secondary | ICD-10-CM | POA: Diagnosis not present

## 2016-10-13 DIAGNOSIS — R1011 Right upper quadrant pain: Secondary | ICD-10-CM | POA: Insufficient documentation

## 2016-10-13 DIAGNOSIS — Z79899 Other long term (current) drug therapy: Secondary | ICD-10-CM | POA: Insufficient documentation

## 2016-10-13 DIAGNOSIS — K5730 Diverticulosis of large intestine without perforation or abscess without bleeding: Secondary | ICD-10-CM | POA: Insufficient documentation

## 2016-10-13 DIAGNOSIS — I10 Essential (primary) hypertension: Secondary | ICD-10-CM | POA: Diagnosis not present

## 2016-10-13 DIAGNOSIS — K319 Disease of stomach and duodenum, unspecified: Secondary | ICD-10-CM | POA: Insufficient documentation

## 2016-10-13 DIAGNOSIS — Z7951 Long term (current) use of inhaled steroids: Secondary | ICD-10-CM | POA: Insufficient documentation

## 2016-10-13 DIAGNOSIS — R1013 Epigastric pain: Secondary | ICD-10-CM | POA: Diagnosis not present

## 2016-10-13 DIAGNOSIS — R7303 Prediabetes: Secondary | ICD-10-CM | POA: Diagnosis not present

## 2016-10-13 DIAGNOSIS — Z8371 Family history of colonic polyps: Secondary | ICD-10-CM | POA: Diagnosis not present

## 2016-10-13 DIAGNOSIS — K760 Fatty (change of) liver, not elsewhere classified: Secondary | ICD-10-CM | POA: Insufficient documentation

## 2016-10-13 DIAGNOSIS — Z9049 Acquired absence of other specified parts of digestive tract: Secondary | ICD-10-CM | POA: Diagnosis not present

## 2016-10-13 DIAGNOSIS — R634 Abnormal weight loss: Secondary | ICD-10-CM | POA: Diagnosis not present

## 2016-10-13 DIAGNOSIS — K529 Noninfective gastroenteritis and colitis, unspecified: Secondary | ICD-10-CM | POA: Insufficient documentation

## 2016-10-13 HISTORY — PX: ESOPHAGOGASTRODUODENOSCOPY (EGD) WITH PROPOFOL: SHX5813

## 2016-10-13 HISTORY — PX: COLONOSCOPY WITH PROPOFOL: SHX5780

## 2016-10-13 SURGERY — COLONOSCOPY WITH PROPOFOL
Anesthesia: General

## 2016-10-13 MED ORDER — PROPOFOL 500 MG/50ML IV EMUL
INTRAVENOUS | Status: DC | PRN
  Administered 2016-10-13: 160 ug/kg/min via INTRAVENOUS

## 2016-10-13 MED ORDER — LIDOCAINE HCL (CARDIAC) 20 MG/ML IV SOLN
INTRAVENOUS | Status: DC | PRN
  Administered 2016-10-13: 60 mg via INTRAVENOUS

## 2016-10-13 MED ORDER — FENTANYL CITRATE (PF) 100 MCG/2ML IJ SOLN
INTRAMUSCULAR | Status: DC | PRN
  Administered 2016-10-13: 25 ug via INTRAVENOUS

## 2016-10-13 MED ORDER — PHENYLEPHRINE HCL 10 MG/ML IJ SOLN
INTRAMUSCULAR | Status: DC | PRN
  Administered 2016-10-13: 100 ug via INTRAVENOUS

## 2016-10-13 MED ORDER — MIDAZOLAM HCL 2 MG/2ML IJ SOLN
INTRAMUSCULAR | Status: DC | PRN
  Administered 2016-10-13: 2 mg via INTRAVENOUS

## 2016-10-13 MED ORDER — SODIUM CHLORIDE 0.9 % IV SOLN: INTRAVENOUS | Status: DC

## 2016-10-13 MED ORDER — PROPOFOL 10 MG/ML IV BOLUS
INTRAVENOUS | Status: DC | PRN
  Administered 2016-10-13: 50 mg via INTRAVENOUS

## 2016-10-13 MED ORDER — SODIUM CHLORIDE 0.9 % IV SOLN
INTRAVENOUS | Status: DC
  Administered 2016-10-13: 17:00:00 via INTRAVENOUS
  Administered 2016-10-13: 1000 mL via INTRAVENOUS
  Administered 2016-10-13: 16:00:00 via INTRAVENOUS

## 2016-10-13 NOTE — Anesthesia Postprocedure Evaluation (Signed)
Anesthesia Post Note  Patient: Kayla Newman  Procedure(s) Performed: Procedure(s) (LRB): COLONOSCOPY WITH PROPOFOL (N/A) ESOPHAGOGASTRODUODENOSCOPY (EGD) WITH PROPOFOL (N/A)  Patient location during evaluation: Endoscopy Anesthesia Type: General Level of consciousness: awake and alert Pain management: pain level controlled Vital Signs Assessment: post-procedure vital signs reviewed and stable Respiratory status: spontaneous breathing, nonlabored ventilation and respiratory function stable Cardiovascular status: blood pressure returned to baseline and stable Postop Assessment: no signs of nausea or vomiting Anesthetic complications: no    Last Vitals:  Vitals:   10/13/16 1720 10/13/16 1730  BP: 97/79 119/81  Pulse: 72 65  Resp: 16 16  Temp:      Last Pain:  Vitals:   10/13/16 1700  TempSrc: Tympanic                 Martha Clan

## 2016-10-13 NOTE — H&P (Signed)
Primary Care Physician:  Nani Ravens, PA-C Primary Gastroenterologist:  Dr. Vira Agar  Pre-Procedure History & Physical: HPI:  Kayla Newman is a 45 y.o. female is here for an endoscopy and colonoscopy.   Past Medical History:  Diagnosis Date  . Gallstones   . Hypertension   . Seasonal allergies     Past Surgical History:  Procedure Laterality Date  . BREAST LUMPECTOMY WITH RADIOACTIVE SEED LOCALIZATION Left 08/12/2015   Procedure: LEFT BREAST LUMPECTOMY WITH RADIOACTIVE SEED LOCALIZATION;  Surgeon: Erroll Luna, MD;  Location: [facility];  Service: General;  Laterality: Left;  . BREAST SURGERY    . CESAREAN SECTION  1997  . CHOLECYSTECTOMY    . TUBAL LIGATION      Prior to Admission medications   Medication Sig Start Date End Date Taking? Authorizing Provider  cetirizine (ZYRTEC) 10 MG tablet Take 10 mg by mouth daily.   Yes Historical Provider, MD  cholestyramine light (PREVALITE) 4 G packet Take 4 g by mouth 2 (two) times daily.   Yes Historical Provider, MD  fluticasone (FLONASE) 50 MCG/ACT nasal spray Place 2 sprays into the nose 1 day or 1 dose.   Yes Historical Provider, MD  montelukast (SINGULAIR) 10 MG tablet Take 10 mg by mouth at bedtime.   Yes Historical Provider, MD  Omega-3 Fatty Acids (FISH OIL PO) Take 1 tablet by mouth 1 day or 1 dose.   Yes Historical Provider, MD  metoprolol succinate (TOPROL-XL) 25 MG 24 hr tablet Take 25 mg by mouth daily.    Historical Provider, MD  oxyCODONE-acetaminophen (ROXICET) 5-325 MG per tablet Take 1-2 tablets by mouth every 4 (four) hours as needed. Patient not taking: Reported on 10/13/2016 08/12/15   Erroll Luna, MD    Allergies as of 09/30/2016  . (No Known Allergies)    History reviewed. No pertinent family history.  Social History   Social History  . Marital status: Married    Spouse name: N/A  . Number of children: N/A  . Years of education: N/A   Occupational History  . Not on file.    Social History Main Topics  . Smoking status: Never Smoker  . Smokeless tobacco: Never Used  . Alcohol use No  . Drug use: No  . Sexual activity: Yes    Birth control/ protection: Surgical   Other Topics Concern  . Not on file   Social History Narrative  . No narrative on file    Review of Systems: See HPI, otherwise negative ROS  Physical Exam: BP 133/88   Pulse 87   Temp 97.8 F (36.6 C) (Tympanic)   Resp 20   Ht 4\' 11"  (1.499 m)   Wt 65.8 kg (145 lb)   LMP 09/26/2016 (Exact Date)   BMI 29.29 kg/m  General:   Alert,  pleasant and cooperative in NAD Head:  Normocephalic and atraumatic. Neck:  Supple; no masses or thyromegaly. Lungs:  Clear throughout to auscultation.    Heart:  Regular rate and rhythm. Abdomen:  Soft, nontender and nondistended. Normal bowel sounds, without guarding, and without rebound.   Neurologic:  Alert and  oriented x4;  grossly normal neurologically.  Impression/Plan: [name] is here for an endoscopy and colonoscopy to be performed for chronic diarrhea, weight loss, FH colon polyps. Chronic abd pain.  Risks, benefits, limitations, and alternatives regarding  endoscopy and colonoscopy have been reviewed with the patient.  Questions have been answered.  All parties agreeable.   ELLIOTT, ROBERT,  MD  10/13/2016, 4:13 PM

## 2016-10-13 NOTE — Transfer of Care (Signed)
Immediate Anesthesia Transfer of Care Note  Patient: Kayla Newman  Procedure(s) Performed: Procedure(s): COLONOSCOPY WITH PROPOFOL (N/A) ESOPHAGOGASTRODUODENOSCOPY (EGD) WITH PROPOFOL (N/A)  Patient Location: Endoscopy Unit  Anesthesia Type:General  Level of Consciousness: awake, alert , oriented and patient cooperative  Airway & Oxygen Therapy: Patient Spontanous Breathing and Patient connected to nasal cannula oxygen  Post-op Assessment: Report given to RN, Post -op Vital signs reviewed and stable and Patient moving all extremities X 4  Post vital signs: Reviewed and stable  Last Vitals:  Vitals:   10/13/16 1527  BP: 133/88  Pulse: 87  Resp: 20  Temp: 36.6 C    Last Pain:  Vitals:   10/13/16 1527  TempSrc: Tympanic         Complications: No apparent anesthesia complications

## 2016-10-13 NOTE — Op Note (Signed)
[facility] Patient Name: Kayla Newman Procedure Date: 10/13/2016 4:14 PM MRN: [id number] Account #: [id number] Date of Birth: Jul 27, 1971 Admit Type: Outpatient Age: 45 Room: [facility] ENDO ROOM 4 Gender: Female Note Status: Finalized Procedure:            Upper GI endoscopy Indications:          Abdominal pain in the right upper quadrant, Dyspepsia Providers:            Manya Silvas, MD Referring MD:         Fransisco Beau, MD (Referring MD) Medicines:            Propofol per Anesthesia Complications:        No immediate complications. Procedure:            Pre-Anesthesia Assessment:                       - After reviewing the risks and benefits, the patient                        was deemed in satisfactory condition to undergo the                        procedure.                       After obtaining informed consent, the endoscope was                        passed under direct vision. Throughout the procedure,                        the patient's blood pressure, pulse, and oxygen                        saturations were monitored continuously. The Endoscope                        was introduced through the mouth, and advanced to the                        second part of duodenum. The upper GI endoscopy was                        accomplished without difficulty. The patient tolerated                        the procedure well. Findings:      The examined esophagus was normal. GEJ 37cm.      Diffuse patchy, mildly erythematous mucosa without bleeding was found in       the gastric body and in the gastric antrum. Biopsies were taken with a       cold forceps for histology. Biopsies were taken with a cold forceps for       Helicobacter pylori testing.      The examined duodenum was normal. Impression:           - Normal esophagus.                       - Erythematous mucosa in the gastric body and antrum.  Biopsied.                   - Normal examined duodenum. Recommendation:       - Await pathology results. Manya Silvas, MD 10/13/2016 4:37:44 PM This report has been signed electronically. Number of Addenda: 0 Note Initiated On: 10/13/2016 4:14 PM      [facility]

## 2016-10-13 NOTE — Anesthesia Preprocedure Evaluation (Signed)
Anesthesia Evaluation  Patient identified by MRN, date of birth, ID band Patient awake    Reviewed: Allergy & Precautions, H&P , NPO status , Patient's Chart, lab work & pertinent test results, reviewed documented beta blocker date and time   History of Anesthesia Complications Negative for: history of anesthetic complications  Airway Mallampati: II  TM Distance: >3 FB Neck ROM: full    Dental no notable dental hx. (+) Missing   Pulmonary neg pulmonary ROS,           Cardiovascular Exercise Tolerance: Good hypertension (controlled with weight loss, not on medications), (-) angina(-) CAD, (-) Past MI, (-) Cardiac Stents and (-) CABG (-) dysrhythmias (-) Valvular Problems/Murmurs     Neuro/Psych negative neurological ROS  negative psych ROS   GI/Hepatic GERD  ,NAFLD   Endo/Other  diabetes (Pre-diabetic)  Renal/GU negative Renal ROS  negative genitourinary   Musculoskeletal   Abdominal   Peds  Hematology negative hematology ROS (+)   Anesthesia Other Findings Past Medical History: No date: Gallstones No date: Hypertension No date: Seasonal allergies   Reproductive/Obstetrics negative OB ROS                             Anesthesia Physical Anesthesia Plan  ASA: II  Anesthesia Plan: General   Post-op Pain Management:    Induction:   Airway Management Planned:   Additional Equipment:   Intra-op Plan:   Post-operative Plan:   Informed Consent: I have reviewed the patients History and Physical, chart, labs and discussed the procedure including the risks, benefits and alternatives for the proposed anesthesia with the patient or authorized representative who has indicated his/her understanding and acceptance.   Dental Advisory Given  Plan Discussed with: Anesthesiologist, CRNA and Surgeon  Anesthesia Plan Comments:         Anesthesia Quick Evaluation

## 2016-10-13 NOTE — Op Note (Signed)
[facility] Patient Name: Kayla Newman Procedure Date: 10/13/2016 4:13 PM MRN: [id number] Account #: [id number] Date of Birth: 1971-05-23 Admit Type: Outpatient Age: 45 Room: [facility] ENDO ROOM 4 Gender: Female Note Status: Finalized Procedure:            Colonoscopy Indications:          Abdominal pain in the right upper quadrant, Chronic                        diarrhea, Clinically significant diarrhea of                        unexplained origin Providers:            Manya Silvas, MD Referring MD:         Fransisco Beau, MD (Referring MD) Medicines:            Propofol per Anesthesia Complications:        No immediate complications. Procedure:            Pre-Anesthesia Assessment:                       - After reviewing the risks and benefits, the patient                        was deemed in satisfactory condition to undergo the                        procedure.                       After obtaining informed consent, the colonoscope was                        passed under direct vision. Throughout the procedure,                        the patient's blood pressure, pulse, and oxygen                        saturations were monitored continuously. The                        Colonoscope was introduced through the anus and                        advanced to the the cecum, identified by appendiceal                        orifice and ileocecal valve. The colonoscopy was                        performed without difficulty. The patient tolerated the                        procedure well. The quality of the bowel preparation                        was good. Findings:      Many small-mouthed diverticula were found in the sigmoid colon.      Internal hemorrhoids were found during endoscopy. The  hemorrhoids were       small and Grade I (internal hemorrhoids that do not prolapse).      The exam was otherwise without abnormality. Verroucus small mass  seen on       labia at time of exam of perianal area.      Biopsies done of colon in ascending, transverse, descending and sigmoid       colon due to chronic diarrhea. Impression:           - Diverticulosis in the sigmoid colon.                       - Internal hemorrhoids.                       - The examination was otherwise normal.                       - No specimens collected. Recommendation:       - Await pathology results. Manya Silvas, MD 10/13/2016 4:58:38 PM This report has been signed electronically. Number of Addenda: 0 Note Initiated On: 10/13/2016 4:13 PM Scope Withdrawal Time: 0 hours 9 minutes 46 seconds  Total Procedure Duration: 0 hours 13 minutes 51 seconds       [facility]

## 2016-10-15 LAB — SURGICAL PATHOLOGY

## 2016-10-16 ENCOUNTER — Encounter: Payer: Self-pay | Admitting: Unknown Physician Specialty

## 2017-11-03 IMAGING — CT CT ABD-PELV W/ CM
2 of 5 series · 16 of 46 positions shown, 18 images · IV contrast (isovue)
Comparison: None.

CLINICAL DATA: Chronic diarrhea. Weight loss. Chronic right upper
quadrant abdominal pain. Cholecystectomy in 7904. Left breast
lumpectomy for papilloma in 7904.

EXAM:
CT ABDOMEN AND PELVIS WITH CONTRAST
TECHNIQUE: Multidetector CT imaging of the abdomen and pelvis was performed
using the standard protocol following bolus administration of
intravenous contrast.
CONTRAST:  100 cc Isovue 370 IV.

[Series 2: axial soft tissue · axial · 0.64mm/px · z∈[-698,-278]mm · 13 of 94 slices shown, 15 images]
[im 5/94  soft-tissue]
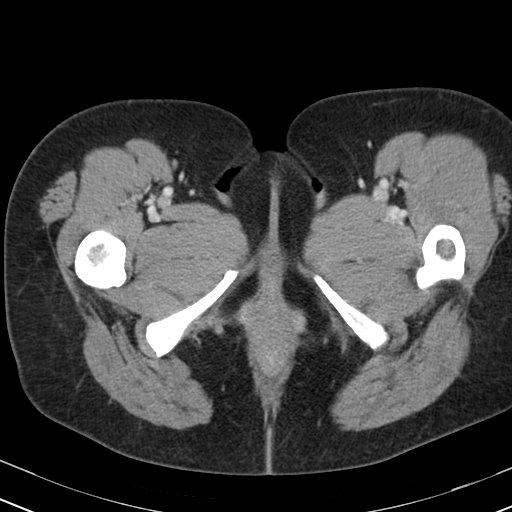
[im 5/94  bone]
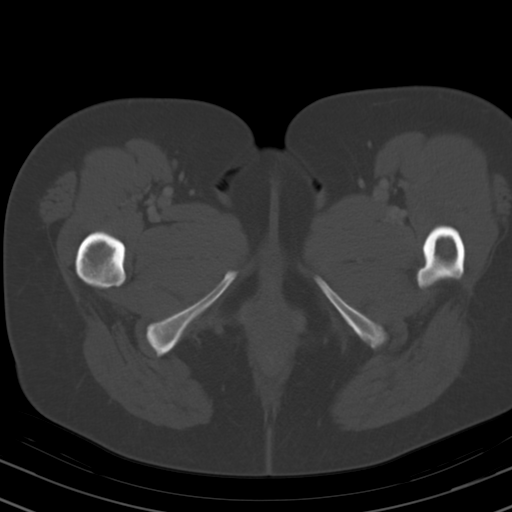
[im 14/94  soft-tissue]
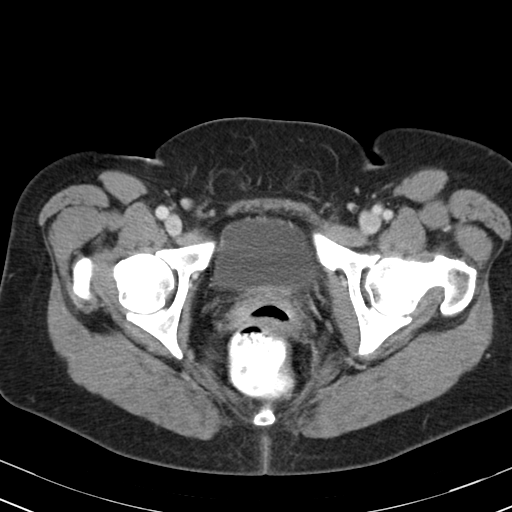
[im 19/94  soft-tissue]
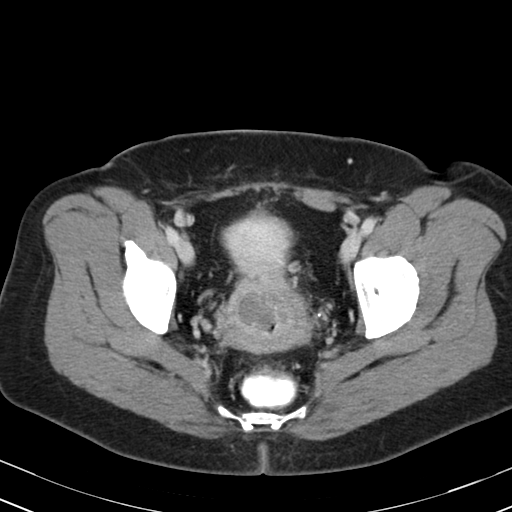
[im 28/94  soft-tissue]
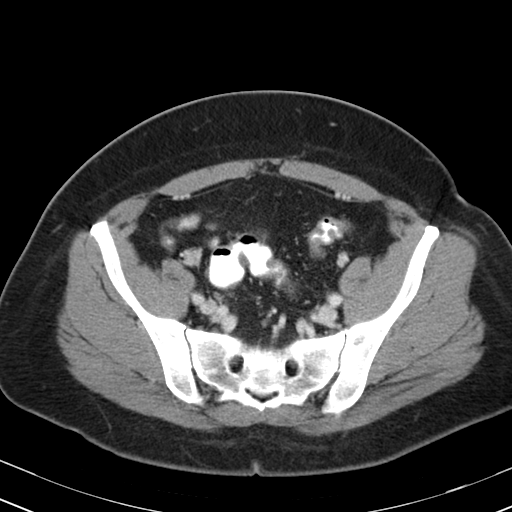
[im 33/94  soft-tissue]
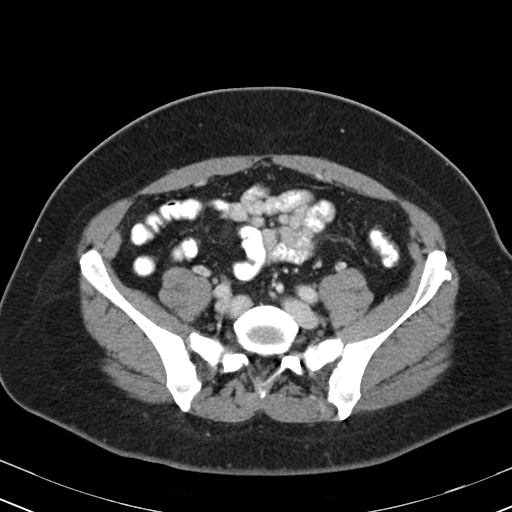
[im 42/94  soft-tissue]
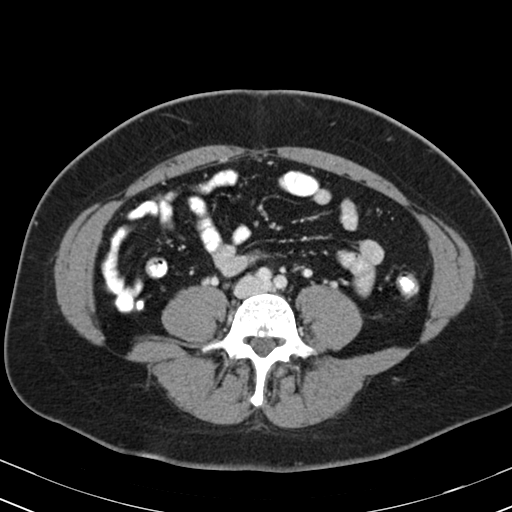
[im 47/94  soft-tissue]
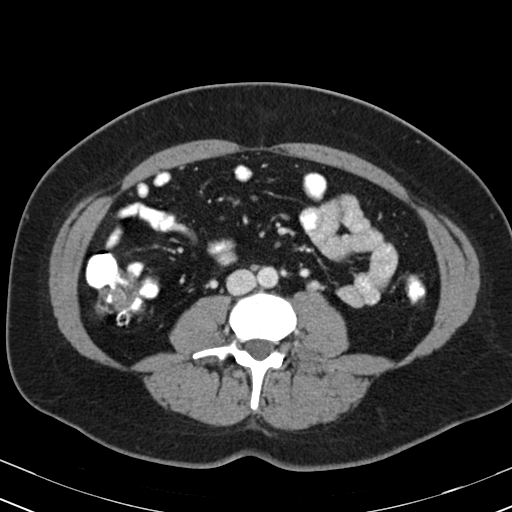
[im 52/94  soft-tissue]
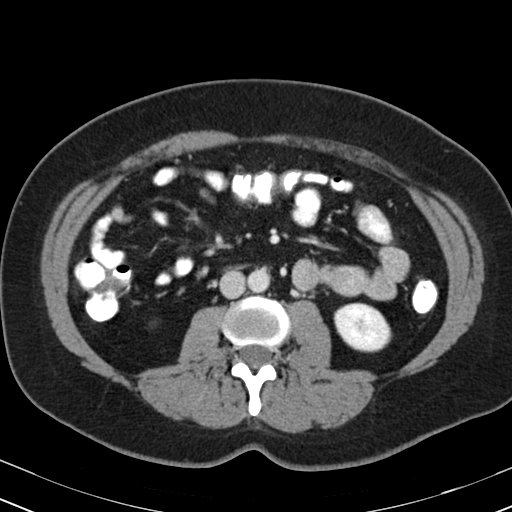
[im 61/94  soft-tissue]
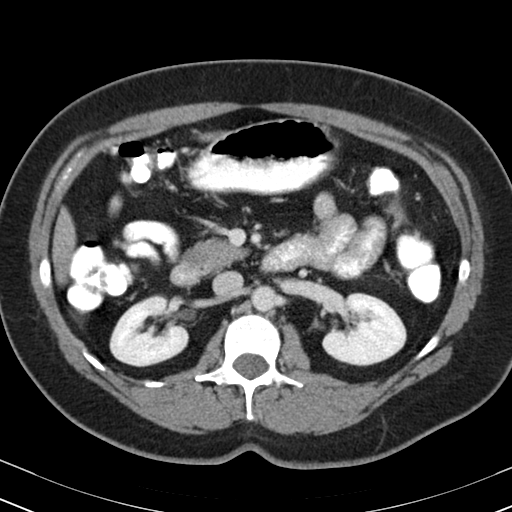
[im 61/94  bone]
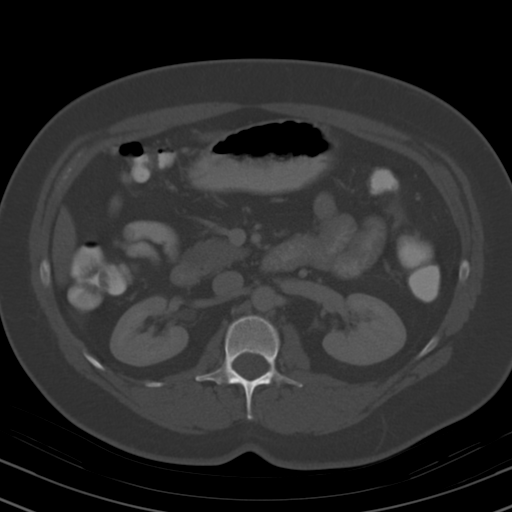
[im 66/94  soft-tissue]
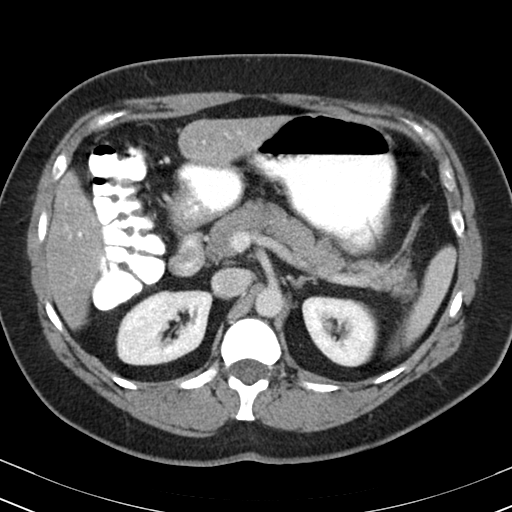
[im 75/94  soft-tissue]
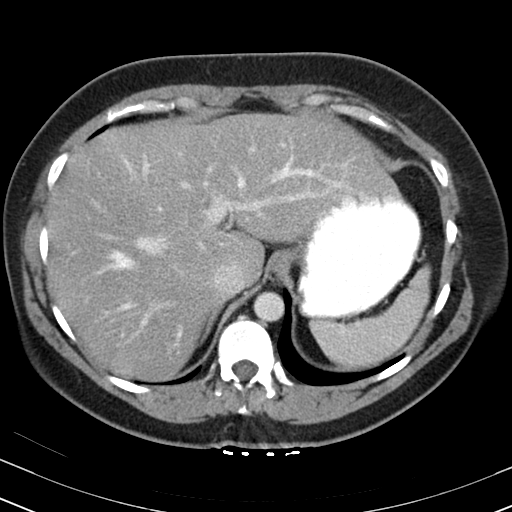
[im 80/94  soft-tissue]
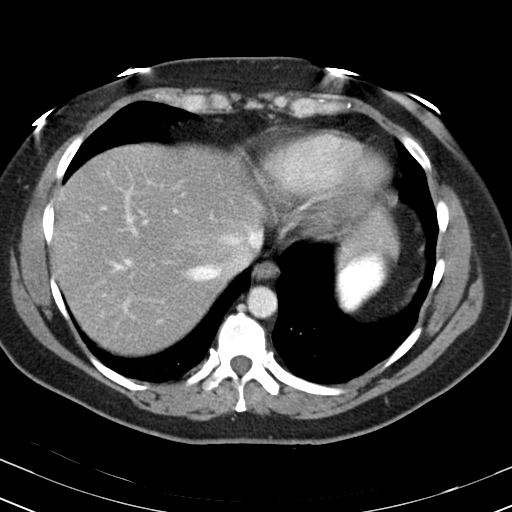
[im 89/94  soft-tissue]
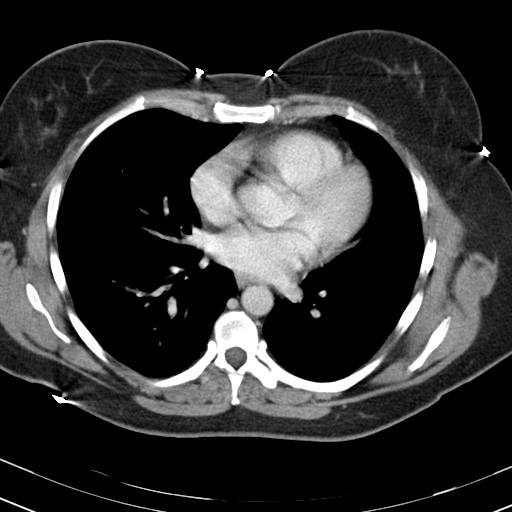

[Series 602: coronal · coronal · 0.91mm/px · 3 of 91 slices shown]
[im 31/91  soft-tissue]
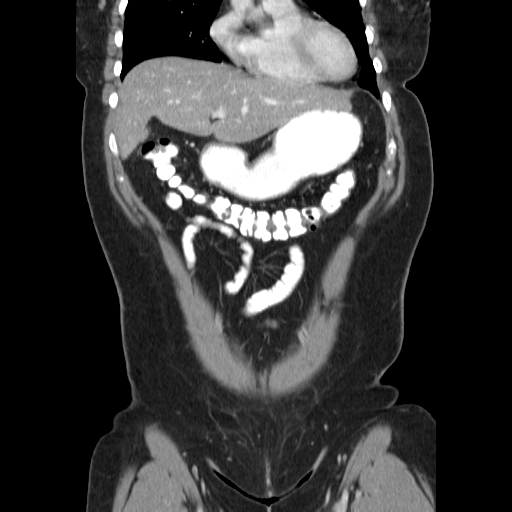
[im 41/91  soft-tissue]
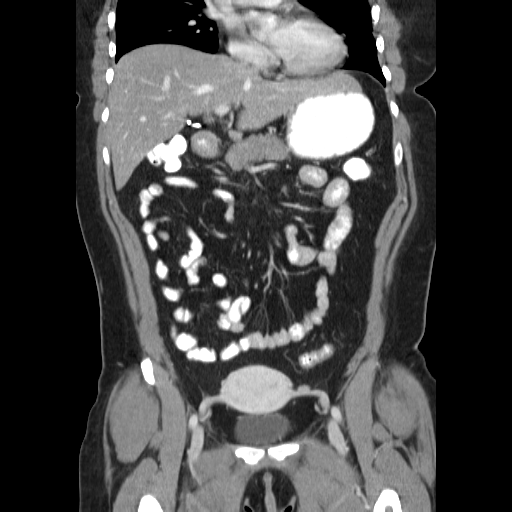
[im 51/91  soft-tissue]
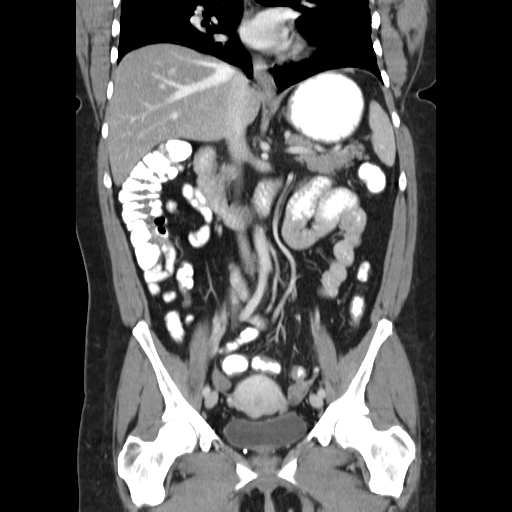

[16 of 46 positions shown; findings below may reference images not displayed]

FINDINGS: Lower chest: No significant pulmonary nodules or acute consolidative
airspace disease.

Hepatobiliary: Normal liver size. Suggestion of diffuse hepatic
steatosis. No liver mass. Cholecystectomy . No biliary ductal
dilatation.

Pancreas: Normal, with no mass or duct dilation.

Spleen: Normal size. No mass.

Adrenals/Urinary Tract: Normal adrenals. No hydronephrosis.
Hypodense 0.6 cm renal cortical lesion in the posterior upper right
kidney, too small to characterize, for which no further follow-up is
required. Normal bladder.

Stomach/Bowel: Grossly normal stomach. Normal caliber small bowel
with no small bowel wall thickening. Normal appendix. There is
questionable focal wall thickening in the cecum inferior to the
ileocecal valve (series 602/ image 57), which could be due to
underdistention. Otherwise normal large bowel and rectum . Oral
contrast progresses to the rectum.

Vascular/Lymphatic: Normal caliber abdominal aorta. Patent portal,
splenic, hepatic and renal veins. No pathologically enlarged lymph
nodes in the abdomen or pelvis.

Reproductive: There is an irregular 3.1 x 2.4 cm soft tissue density
mass in the cervix (series 2/ image 75). Otherwise unremarkable
anteverted uterus. No adnexal mass.

Other: No pneumoperitoneum, ascites or focal fluid collection.

Musculoskeletal: No aggressive appearing focal osseous lesions.
Minimal thoracolumbar spondylosis.
IMPRESSION: 1. Irregular 3.1 cm soft tissue mass in the cervix, cannot exclude
cervical carcinoma. GYN consultation and correlation with pelvic
exam and Pap smear are advised.
2. No abdominopelvic lymphadenopathy.
3. Questionable focal wall thickening in the cecum, which may be
artifactual due to underdistention. Consider correlation with
colonoscopy given the patient's symptoms. No evidence of bowel
obstruction or acute bowel inflammation. Normal appendix.
